# Patient Record
Sex: Male | Born: 1985
Health system: Southern US, Community
[De-identification: ages and names within clinical notes are randomized; demographics above are authoritative.]

## PROBLEM LIST (undated history)

## (undated) DIAGNOSIS — K2100 Gastro-esophageal reflux disease with esophagitis, without bleeding: Secondary | ICD-10-CM

## (undated) DIAGNOSIS — S43006A Unspecified dislocation of unspecified shoulder joint, initial encounter: Secondary | ICD-10-CM

## (undated) DIAGNOSIS — F419 Anxiety disorder, unspecified: Secondary | ICD-10-CM

## (undated) DIAGNOSIS — F439 Reaction to severe stress, unspecified: Secondary | ICD-10-CM

## (undated) DIAGNOSIS — K449 Diaphragmatic hernia without obstruction or gangrene: Secondary | ICD-10-CM

## (undated) DIAGNOSIS — K219 Gastro-esophageal reflux disease without esophagitis: Secondary | ICD-10-CM

## (undated) DIAGNOSIS — F329 Major depressive disorder, single episode, unspecified: Secondary | ICD-10-CM

## (undated) DIAGNOSIS — F32A Depression, unspecified: Secondary | ICD-10-CM

## (undated) HISTORY — DX: Gastro-esophageal reflux disease with esophagitis: K21.0

## (undated) HISTORY — DX: Diaphragmatic hernia without obstruction or gangrene: K44.9

## (undated) HISTORY — DX: Reaction to severe stress, unspecified: F43.9

## (undated) HISTORY — DX: Gastro-esophageal reflux disease with esophagitis, without bleeding: K21.00

## (undated) HISTORY — PX: HYDROCELE EXCISION: SHX482

---
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
## 2007-03-30 ENCOUNTER — Ambulatory Visit (HOSPITAL_COMMUNITY): Payer: Self-pay | Admitting: Psychiatry

## 2007-04-28 ENCOUNTER — Ambulatory Visit (HOSPITAL_COMMUNITY): Payer: Self-pay | Admitting: Psychology

## 2007-05-14 ENCOUNTER — Emergency Department (HOSPITAL_COMMUNITY): Admission: EM | Admit: 2007-05-14 | Discharge: 2007-05-14 | Payer: Self-pay | Admitting: Emergency Medicine

## 2007-08-24 ENCOUNTER — Emergency Department (HOSPITAL_COMMUNITY): Admission: EM | Admit: 2007-08-24 | Discharge: 2007-08-24 | Payer: Self-pay | Admitting: Emergency Medicine

## 2010-09-20 ENCOUNTER — Encounter: Payer: Self-pay | Admitting: Internal Medicine

## 2011-02-04 ENCOUNTER — Emergency Department (HOSPITAL_COMMUNITY): Payer: Self-pay

## 2011-02-04 ENCOUNTER — Emergency Department (HOSPITAL_COMMUNITY)
Admission: EM | Admit: 2011-02-04 | Discharge: 2011-02-04 | Disposition: A | Discharge status: Home / Self Care | Payer: Self-pay | Attending: Emergency Medicine | Admitting: Emergency Medicine

## 2011-02-04 DIAGNOSIS — S93409A Sprain of unspecified ligament of unspecified ankle, initial encounter: Secondary | ICD-10-CM | POA: Insufficient documentation

## 2011-02-04 DIAGNOSIS — Y92009 Unspecified place in unspecified non-institutional (private) residence as the place of occurrence of the external cause: Secondary | ICD-10-CM | POA: Insufficient documentation

## 2011-02-04 DIAGNOSIS — X500XXA Overexertion from strenuous movement or load, initial encounter: Secondary | ICD-10-CM | POA: Insufficient documentation

## 2012-06-30 HISTORY — PX: OTHER SURGICAL HISTORY: SHX169

## 2013-02-01 ENCOUNTER — Other Ambulatory Visit (HOSPITAL_COMMUNITY): Payer: Self-pay | Admitting: Physician Assistant

## 2013-02-01 DIAGNOSIS — K529 Noninfective gastroenteritis and colitis, unspecified: Secondary | ICD-10-CM

## 2013-02-01 DIAGNOSIS — R109 Unspecified abdominal pain: Secondary | ICD-10-CM

## 2013-02-05 ENCOUNTER — Ambulatory Visit (HOSPITAL_COMMUNITY)
Admission: RE | Admit: 2013-02-05 | Discharge: 2013-02-05 | Disposition: A | Discharge status: Home / Self Care | Payer: BC Managed Care – PPO | Source: Ambulatory Visit | Attending: Physician Assistant | Admitting: Physician Assistant

## 2013-02-05 ENCOUNTER — Other Ambulatory Visit (HOSPITAL_COMMUNITY): Payer: Self-pay | Admitting: Physician Assistant

## 2013-02-05 DIAGNOSIS — R109 Unspecified abdominal pain: Secondary | ICD-10-CM

## 2013-02-05 DIAGNOSIS — K529 Noninfective gastroenteritis and colitis, unspecified: Secondary | ICD-10-CM

## 2013-02-06 ENCOUNTER — Ambulatory Visit (INDEPENDENT_AMBULATORY_CARE_PROVIDER_SITE_OTHER): Payer: BC Managed Care – PPO | Admitting: Gastroenterology

## 2013-02-06 ENCOUNTER — Ambulatory Visit (HOSPITAL_COMMUNITY)
Admission: RE | Admit: 2013-02-06 | Discharge: 2013-02-06 | Disposition: A | Discharge status: Home / Self Care | Payer: BC Managed Care – PPO | Source: Ambulatory Visit | Attending: Gastroenterology | Admitting: Gastroenterology

## 2013-02-06 ENCOUNTER — Other Ambulatory Visit: Payer: Self-pay | Admitting: Gastroenterology

## 2013-02-06 ENCOUNTER — Ambulatory Visit: Payer: BC Managed Care – PPO | Admitting: Gastroenterology

## 2013-02-06 ENCOUNTER — Other Ambulatory Visit: Payer: Self-pay

## 2013-02-06 ENCOUNTER — Encounter: Payer: Self-pay | Admitting: Gastroenterology

## 2013-02-06 VITALS — BP 116/76 | HR 68 | Temp 97.6°F | Ht 72.0 in | Wt 143.0 lb

## 2013-02-06 DIAGNOSIS — R1110 Vomiting, unspecified: Secondary | ICD-10-CM | POA: Insufficient documentation

## 2013-02-06 DIAGNOSIS — R1013 Epigastric pain: Secondary | ICD-10-CM

## 2013-02-06 DIAGNOSIS — R9389 Abnormal findings on diagnostic imaging of other specified body structures: Secondary | ICD-10-CM | POA: Insufficient documentation

## 2013-02-06 DIAGNOSIS — R112 Nausea with vomiting, unspecified: Secondary | ICD-10-CM

## 2013-02-06 DIAGNOSIS — R195 Other fecal abnormalities: Secondary | ICD-10-CM | POA: Insufficient documentation

## 2013-02-06 MED ORDER — IOHEXOL 300 MG/ML  SOLN
100.0000 mL | Freq: Once | INTRAMUSCULAR | Status: AC | PRN
  Administered 2013-02-06: 100 mL via INTRAVENOUS

## 2013-02-06 MED ORDER — HYDROCODONE-ACETAMINOPHEN 5-325 MG PO TABS: 1.0000 | ORAL_TABLET | Freq: Four times a day (QID) | ORAL | Status: DC | PRN

## 2013-02-06 NOTE — Progress Notes (Signed)
Primary Care Physician:  Colette Ribas, MD  Primary Gastroenterologist:    Chief Complaint  Patient presents with  . Abdominal Pain    HPI:  Colin Oliver is a 27 y.o. male here for further evaluation of abdominal pain. Patient states that he was doing very well up until last Monday evening when he developed fever. This was associated initially with abdominal pain in the upper abdomen. Temperature got as high as 103. Patient states his fever lasted for about 24 hours. He developed vomiting and diarrhea on Tuesday/Wednesday. He states he vomited about 5 or 6 times and had diarrhea several times over 24 hours or so. He was able to go back to work Wednesday night but felt very weak. Thursday went to PCP due to ongoing abdominal pain. Review blood work done last week including CBC, basic metabolic panel, LFTs. He had a glucose of 133 and eosinophil count 6% but otherwise unremarkable. Hemoglobin A1c was 5.3. Patient states he turned in 3 Hemoccult cards and reports that they were positive. Abd u/s negative yesterday. Patient denies melena or rectal bleeding. Epigastric burning. No heartburn. BM now normal. Appetite has returned. Last meal was Chicken sandwich and fries from [employer] yesterday afternoon. About 15-30 minutes after eating he has increased epigastric pain and burning. Weight is down about 6 pounds with current illness.   Two weeks ago, sister, stepmother were sick but he did not go around him. His daughter developed diarrhea couple of days ago.  Given RX for omeprazole 40mg  daily today by PCP. H. pylori serologies pending  Current Outpatient Prescriptions  Medication Sig Dispense Refill  . clonazePAM (KLONOPIN) 1 MG tablet Take 1 mg by mouth 2 (two) times daily as needed for anxiety.      . promethazine (PHENERGAN) 25 MG tablet Take 25 mg by mouth every 6 (six) hours as needed for nausea.      . sertraline (ZOLOFT) 50 MG tablet Take 50 mg by mouth daily.       No current  facility-administered medications for this visit.    Allergies as of 02/06/2013  . (No Known Allergies)    Past Medical History  Diagnosis Date  . Stress     Past Surgical History  Procedure Laterality Date  . Right shoulder  06/2012  . Hydrocele excision      age 36    Family History  Problem Relation Age of Onset  . Gallbladder disease      Multiple family members    History   Social History  . Marital Status: Single    Spouse Name: N/A    Number of Children: 1  . Years of Education: N/A   Occupational History  . FT student, Optician, dispensing   . [employer] FT, third shift    Social History Main Topics  . Smoking status: Current Every Day Smoker -- 1.00 packs/day    Types: Cigarettes  . Smokeless tobacco: Not on file  . Alcohol Use: Yes     Comment: socially, beer, 6 every other weekend, one to 2 a few days a week  . Drug Use: No  . Sexually Active: Not on file   Other Topics Concern  . Not on file   Social History Narrative  . No narrative on file      ROS:  General: see hpi Eyes: Negative for vision changes.  ENT: Negative for hoarseness, difficulty swallowing , nasal congestion. CV: Negative for chest pain, angina, palpitations, dyspnea on exertion, peripheral edema.  Respiratory: Negative for dyspnea at rest, dyspnea on exertion, cough, sputum, wheezing.  GI: See history of present illness. GU:  Negative for dysuria, hematuria, urinary incontinence, urinary frequency, nocturnal urination.  MS: Negative for joint pain, low back pain.  Derm: Negative for rash or itching.  Neuro: Negative for weakness, abnormal sensation, seizure, frequent headaches, memory loss, confusion.  Psych: Negative for anxiety, depression, suicidal ideation, hallucinations.  Endo: Negative for unusual weight change. See hpi Heme: Negative for bruising or bleeding. Allergy: Negative for rash or hives.    Physical Examination:  BP 116/76  Pulse 68  Temp(Src) 97.6 F (36.4  C) (Oral)  Ht 6' (1.829 m)  Wt 143 lb (64.864 kg)  BMI 19.39 kg/m2   General: Well-nourished, well-developed in no acute distress.  Head: Normocephalic, atraumatic.   Eyes: Conjunctiva pink, no icterus. Mouth: Oropharyngeal mucosa moist and pink , no lesions erythema or exudate. Neck: Supple without thyromegaly, masses, or lymphadenopathy.  Lungs: Clear to auscultation bilaterally.  Heart: Regular rate and rhythm, no murmurs rubs or gallops.  Abdomen: Bowel sounds are normal, moderate epigastric/ruq tenderness, nondistended, no hepatosplenomegaly or masses, no abdominal bruits or hernia , no rebound or guarding.   Rectal: not performed Extremities: No lower extremity edema. No clubbing or deformities.  Neuro: Alert and oriented x 4 , grossly normal neurologically.  Skin: Warm and dry, no rash or jaundice.   Psych: Alert and cooperative, normal mood and affect.  Imaging Studies: US Abdomen Limited Ruq  02-28-13   *RADIOLOGY REPORT*  Clinical Data:  Abdominal pain.  GALLBLADDER ULTRASOUND  Comparison:  None  Findings:  Gallbladder:  No gallstones, gallbladder wall thickening, or pericholecystic fluid.  Common Bile Duct:  Within normal limits in caliber.  Liver:  No focal lesion identified.  Within normal limits in parenchymal echogenicity.  IMPRESSION: Negative gallbladder ultrasound.   Original Report Authenticated By: Signa Kell, M.D.    Labs: 02/01/2013 Sodium 137, potassium 3.9, glucose 133, BUN 8, creatinine 0.89, total bilirubin 0.4, alkaline phosphatase 76, AST 19, ALT 19, albumin 4.2, white blood cell count 6100, hemoglobin 14.6, hematocrit 41.7, MCV 86.7, platelets 235,000, eosinophil count 6%, hemoglobin A1c 5.3.

## 2013-02-06 NOTE — Assessment & Plan Note (Signed)
27 year old gentleman with what sounds like a recent acute gastroenteritis including fever, vomiting, diarrhea, multiple ill contacts he presents for further evaluation of ongoing upper abdominal pain and Hemoccult-positive stool. Recent lab work unremarkable. Abdominal ultrasound negative. At this point cannot exclude gastritis plus or minus abdominal wall pain from heaving superimposed on recent viral gastroenteritis. Ddx includes pancreatitis, PUD, biliary. Discussed at length with patient today, given significant abdominal pain I feel he needs to have stat CT of the abdomen and pelvis with contrast to look at his pancreas as well as reevaluate his gallbladder. If CT is negative he will likely need to have had a minimal upper endoscopy for ongoing upper abdominal pain and Hemoccult-positive stool. May ultimately need HIDA scan to exclude gallbladder disease. Fully agree with adding PPI at this time. I have given him Vicodin to use sparingly for pain. We will followup on H. pylori serologies as they become available. I have added on lipase to today's blood work as well. Further recommendations to follow.

## 2013-02-06 NOTE — Progress Notes (Signed)
CC PCP 

## 2013-02-06 NOTE — Patient Instructions (Addendum)
1. CT scan as scheduled. 2. I have given you prescription for pain medication. Please be aware it may cause drowsiness. 3. After your blood work and CT scan results are available, we will touch base with you and determine what needs to be done next. You may still need further evaluation of the small amount of blood found in your stool.

## 2013-02-06 NOTE — Progress Notes (Signed)
Quick Note:  Spoke with Dr. Archer Asa. Patient's CT shows abnormal right kidney. CT reviewed with several radiologist.   Focal rounded 1.8 x 1.4 cm of abnormal parenchymal enhancement in the lateral aspect of the right lower renal pole. Could be anything from focal infection of kidney to benign finding to early renal cell CA.  Needs U/A with culture now.  If U/A is negative, he will need MRI Abd with and without contrast to look at the kidney more closely. DO NOT ORDER YET! Please inquire if he is having right sided back pain. If he develops recurrent fever or increasing pain he needs to let me know or go to ED. ______

## 2013-02-07 ENCOUNTER — Telehealth: Payer: Self-pay | Admitting: Gastroenterology

## 2013-02-07 ENCOUNTER — Other Ambulatory Visit: Payer: Self-pay | Admitting: Gastroenterology

## 2013-02-07 ENCOUNTER — Telehealth: Payer: Self-pay | Admitting: Internal Medicine

## 2013-02-07 LAB — COMPREHENSIVE METABOLIC PANEL
ALT: 19 U/L (ref 10–40)
Albumin: 4.2
Alkaline Phosphatase: 76 U/L
BUN: 8 mg/dL (ref 4–21)
Glucose: 133
Potassium: 3.9 mmol/L
Total Bilirubin: 0.4 mg/dL

## 2013-02-07 NOTE — Telephone Encounter (Signed)
U/A is not back yet and pt is aware.

## 2013-02-07 NOTE — Telephone Encounter (Signed)
Calling asking for lab results as well as letting us know he is still in a lot of pain please advise?

## 2013-02-07 NOTE — Telephone Encounter (Signed)
Pt called this afternoon asking if his lab results were back yet. Please advise and call him back [phone]

## 2013-02-08 LAB — URINALYSIS W MICROSCOPIC + REFLEX CULTURE
Bilirubin Urine: NEGATIVE
Protein, ur: NEGATIVE mg/dL
Urobilinogen, UA: 0.2 mg/dL (ref 0.0–1.0)

## 2013-02-08 NOTE — Progress Notes (Signed)
Quick Note:  U/A with evidence of infection. Please make sure lab did a culture. Patient should start Cipro 500mg  po bid for 7 days. Call in RX for QS. Is he having any pain with urination or penile discharge? If abdominal pain worsens or develops fever, back pain, he should go to ER. ______

## 2013-02-08 NOTE — Telephone Encounter (Signed)
Routing to LSL- UA is back but culture is still pending.

## 2013-02-08 NOTE — Telephone Encounter (Signed)
Please call patient with u/a results. Needs antibiotic.

## 2013-02-09 NOTE — Telephone Encounter (Signed)
See results note. 

## 2013-02-10 LAB — URINE CULTURE: Colony Count: >=100000

## 2013-02-19 NOTE — Progress Notes (Signed)
Quick Note:  Called to speak with patient. LMOAM.  1. He should have completed antibiotics by now.Find out how he is doing? Any abdominal pain, dysuria, penile discharge.  2. He is going to need MRI Abdomen with and without contrast in one month to f/u on right kidney abnormality. (Discussed with Dr. Jena Gauss). 3. He may need to see urologist if MRI comes back normal as young men do not routinely develop kidney infections. But we will await MRI findings first. ______

## 2013-02-22 NOTE — Progress Notes (Signed)
Quick Note:  Tried to call pt- LMOM ______ 

## 2013-02-26 NOTE — Progress Notes (Signed)
Cc Results to PCP and nicd MRI in the Recalls

## 2013-02-27 ENCOUNTER — Telehealth: Payer: Self-pay

## 2013-02-27 NOTE — Telephone Encounter (Signed)
This has already been nicd in the computer

## 2013-02-27 NOTE — Telephone Encounter (Signed)
Pt finally returned call from 02/26/13 CT results. His urine is now normal color and smell, no abnormal discharge. He had diarrhea for 3 weeks and everytime he ate something he would have to run to the bathroom to have a BM. He said his bowel movements have been normal for the past 2 days and he has been eating ok for the past 2 days. He still has the upper abd pain that comes and goes and sometimes when it starts it doubles him over with pain.   He stated it was fine to set him up for an MRI in one month. He will need to work around some court dates for his custody trial. Benedetto Goad, do you want to nic this or go ahead and set it up now?

## 2013-02-28 NOTE — Telephone Encounter (Addendum)
Need copy of H.Pylori serologies done by PCP. MRI as planned. For ongoing upper abdominal pain and heme positive stool-->consider EGD if patient agreeable. Needs to take PPI as encouraged at time of OV.  If not interested in pursuing EGD at this time, take PPI, do ifobt, OV with RMR 03/2013.

## 2013-03-01 NOTE — Telephone Encounter (Signed)
Darl Pikes, please get copy of hpylori blood work from [facility]

## 2013-03-05 NOTE — Telephone Encounter (Signed)
Tried to call pt- LMOM 

## 2013-03-06 ENCOUNTER — Encounter: Payer: Self-pay | Admitting: Internal Medicine

## 2013-03-06 ENCOUNTER — Other Ambulatory Visit (HOSPITAL_COMMUNITY): Payer: Self-pay | Admitting: Physician Assistant

## 2013-03-06 DIAGNOSIS — N059 Unspecified nephritic syndrome with unspecified morphologic changes: Secondary | ICD-10-CM

## 2013-03-06 NOTE — Telephone Encounter (Signed)
Spoke with pt- it is ok to set up egd with RMR. Leighann, please set up egd.  hpylori bloodwork on LSL cart.

## 2013-03-06 NOTE — Telephone Encounter (Signed)
Requested records from PCP and pt is aware of OV on 8/8 at 10 with RMR and appt card was mailed

## 2013-03-06 NOTE — Telephone Encounter (Signed)
His 30 day H&P is up, do we retriage or what? He is scheduled to see RMR in the office on 8/8 please advise?

## 2013-03-07 ENCOUNTER — Ambulatory Visit (HOSPITAL_COMMUNITY)
Admission: RE | Admit: 2013-03-07 | Discharge: 2013-03-07 | Disposition: A | Discharge status: Home / Self Care | Payer: BC Managed Care – PPO | Source: Ambulatory Visit | Attending: Physician Assistant | Admitting: Physician Assistant

## 2013-03-07 DIAGNOSIS — N289 Disorder of kidney and ureter, unspecified: Secondary | ICD-10-CM | POA: Insufficient documentation

## 2013-03-07 DIAGNOSIS — N059 Unspecified nephritic syndrome with unspecified morphologic changes: Secondary | ICD-10-CM

## 2013-03-07 MED ORDER — GADOBENATE DIMEGLUMINE 529 MG/ML IV SOLN
13.0000 mL | Freq: Once | INTRAVENOUS | Status: AC | PRN
  Administered 2013-03-07: 13 mL via INTRAVENOUS

## 2013-03-08 NOTE — Telephone Encounter (Addendum)
Triage him since he is a straightforward patient. He will need Phenergan 25mg  IV 30 mins before procedure.

## 2013-03-08 NOTE — Telephone Encounter (Signed)
Routing to Doris for Triage 

## 2013-03-09 ENCOUNTER — Telehealth: Payer: Self-pay

## 2013-03-09 NOTE — Telephone Encounter (Signed)
Pt called-  He stated he knew we were going to do an MRI in 1 month but he went to [facility] and was sent for an MRI yesterday. He said he was told by the radiologist that he had a lesion on his kidney and a growth on his kidney. He said he is extremely worried about this and has tried to call Robbie Lis several times and cant get thru. He is worried something is wrong. He wants to know if LSL can look at it and tell him what is wrong.

## 2013-03-12 NOTE — Telephone Encounter (Signed)
Would have been helpful if he had waited for one month after completion of antibiotics. This was to allow signs of infection to be gone.  MRI shows ?scarring possibly from prior kidney infection.  I would recommend he see an urologist for opinion regarding CT and MRI findings as well as pyelonephritis in young male.  EGD as planned. OV with RMR as planned.

## 2013-03-13 NOTE — Telephone Encounter (Signed)
Tried to call pt- LMOM. Asked pt to call me if Colin Oliver did not give him his results.

## 2013-03-14 ENCOUNTER — Telehealth (INDEPENDENT_AMBULATORY_CARE_PROVIDER_SITE_OTHER): Payer: Self-pay

## 2013-03-14 ENCOUNTER — Other Ambulatory Visit: Payer: Self-pay

## 2013-03-14 ENCOUNTER — Other Ambulatory Visit: Payer: Self-pay | Admitting: Gastroenterology

## 2013-03-14 DIAGNOSIS — R93429 Abnormal radiologic findings on diagnostic imaging of unspecified kidney: Secondary | ICD-10-CM

## 2013-03-14 DIAGNOSIS — R1013 Epigastric pain: Secondary | ICD-10-CM

## 2013-03-14 DIAGNOSIS — N12 Tubulo-interstitial nephritis, not specified as acute or chronic: Secondary | ICD-10-CM

## 2013-03-14 NOTE — Telephone Encounter (Signed)
Noted  

## 2013-03-14 NOTE — Telephone Encounter (Signed)
Patient is scheduled with Dr. Annabell Howells at [facility] Friday 03/16/13 at 9:45 and I have faxed notes over

## 2013-03-14 NOTE — Telephone Encounter (Signed)
Spoke with pt- he did speak with Robbie Lis about his results. Tyler Aas is triaging pt for egd and pt is scheduled to see RMR on 04/06/13. He said it was ok to refer to urology. Benedetto Goad, please refer.

## 2013-03-14 NOTE — Telephone Encounter (Signed)
Pt called to schedule the EGD. He had rotator cuff surgery on his right shoulder in 06/2012.   Reviewed his meds and allergies. He is scheduled for 03/21/2013 at 1:30 PM with RMR.

## 2013-03-14 NOTE — Telephone Encounter (Signed)
See triage

## 2013-03-14 NOTE — Telephone Encounter (Signed)
error 

## 2013-03-15 NOTE — Telephone Encounter (Signed)
EGD instructions mailed to pt.

## 2013-03-16 ENCOUNTER — Ambulatory Visit (INDEPENDENT_AMBULATORY_CARE_PROVIDER_SITE_OTHER): Payer: BC Managed Care – PPO | Admitting: Urology

## 2013-03-16 DIAGNOSIS — A630 Anogenital (venereal) warts: Secondary | ICD-10-CM

## 2013-03-16 DIAGNOSIS — N10 Acute tubulo-interstitial nephritis: Secondary | ICD-10-CM

## 2013-03-19 ENCOUNTER — Encounter (HOSPITAL_COMMUNITY): Payer: Self-pay | Admitting: Pharmacy Technician

## 2013-03-21 ENCOUNTER — Ambulatory Visit (HOSPITAL_COMMUNITY)
Admission: RE | Admit: 2013-03-21 | Discharge: 2013-03-21 | Disposition: A | Discharge status: Home / Self Care | Payer: BC Managed Care – PPO | Source: Ambulatory Visit | Attending: Internal Medicine | Admitting: Internal Medicine

## 2013-03-21 ENCOUNTER — Encounter (HOSPITAL_COMMUNITY)
Admission: RE | Disposition: A | Discharge status: Home / Self Care | Payer: Self-pay | Source: Ambulatory Visit | Attending: Internal Medicine

## 2013-03-21 ENCOUNTER — Encounter (HOSPITAL_COMMUNITY): Payer: Self-pay | Admitting: *Deleted

## 2013-03-21 DIAGNOSIS — K449 Diaphragmatic hernia without obstruction or gangrene: Secondary | ICD-10-CM | POA: Insufficient documentation

## 2013-03-21 DIAGNOSIS — K3189 Other diseases of stomach and duodenum: Secondary | ICD-10-CM

## 2013-03-21 DIAGNOSIS — K2940 Chronic atrophic gastritis without bleeding: Secondary | ICD-10-CM | POA: Insufficient documentation

## 2013-03-21 DIAGNOSIS — K2960 Other gastritis without bleeding: Secondary | ICD-10-CM

## 2013-03-21 DIAGNOSIS — R1013 Epigastric pain: Secondary | ICD-10-CM

## 2013-03-21 DIAGNOSIS — K2100 Gastro-esophageal reflux disease with esophagitis, without bleeding: Secondary | ICD-10-CM | POA: Insufficient documentation

## 2013-03-21 DIAGNOSIS — R109 Unspecified abdominal pain: Secondary | ICD-10-CM | POA: Insufficient documentation

## 2013-03-21 HISTORY — PX: ESOPHAGOGASTRODUODENOSCOPY: SHX5428

## 2013-03-21 SURGERY — EGD (ESOPHAGOGASTRODUODENOSCOPY)
Anesthesia: Moderate Sedation

## 2013-03-21 MED ORDER — SODIUM CHLORIDE 0.9 % IV SOLN
INTRAVENOUS | Status: DC
  Administered 2013-03-21: 14:00:00 via INTRAVENOUS

## 2013-03-21 MED ORDER — PROMETHAZINE HCL 25 MG/ML IJ SOLN
25.0000 mg | Freq: Once | INTRAMUSCULAR | Status: AC
  Administered 2013-03-21: 25 mg via INTRAVENOUS

## 2013-03-21 MED ORDER — BUTAMBEN-TETRACAINE-BENZOCAINE 2-2-14 % EX AERO
INHALATION_SPRAY | CUTANEOUS | Status: DC | PRN
  Administered 2013-03-21: 2 via TOPICAL

## 2013-03-21 MED ORDER — ONDANSETRON HCL 4 MG/2ML IJ SOLN
INTRAMUSCULAR | Status: AC
  Filled 2013-03-21: qty 2

## 2013-03-21 MED ORDER — SODIUM CHLORIDE 0.9 % IJ SOLN
INTRAMUSCULAR | Status: AC
  Filled 2013-03-21: qty 10

## 2013-03-21 MED ORDER — MIDAZOLAM HCL 5 MG/5ML IJ SOLN
INTRAMUSCULAR | Status: DC | PRN
  Administered 2013-03-21 (×2): 2 mg via INTRAVENOUS

## 2013-03-21 MED ORDER — MEPERIDINE HCL 100 MG/ML IJ SOLN
INTRAMUSCULAR | Status: AC
  Filled 2013-03-21: qty 2

## 2013-03-21 MED ORDER — STERILE WATER FOR IRRIGATION IR SOLN
Status: DC | PRN
  Administered 2013-03-21: 14:00:00

## 2013-03-21 MED ORDER — PROMETHAZINE HCL 25 MG/ML IJ SOLN
INTRAMUSCULAR | Status: AC
  Filled 2013-03-21: qty 1

## 2013-03-21 MED ORDER — MIDAZOLAM HCL 5 MG/5ML IJ SOLN
INTRAMUSCULAR | Status: AC
  Filled 2013-03-21: qty 10

## 2013-03-21 MED ORDER — MEPERIDINE HCL 100 MG/ML IJ SOLN
INTRAMUSCULAR | Status: DC | PRN
  Administered 2013-03-21 (×2): 50 mg via INTRAVENOUS

## 2013-03-21 MED ORDER — ONDANSETRON HCL 4 MG/2ML IJ SOLN
INTRAMUSCULAR | Status: DC | PRN
  Administered 2013-03-21: 4 mg via INTRAVENOUS

## 2013-03-21 NOTE — H&P (Signed)
Chief Complaint   Patient presents with   .  Abdominal Pain        HPI:  Colin Oliver is a 27 y.o. male here for further evaluation of abdominal pain.   Recent fever. This was associated initially with abdominal pain in the upper abdomen. Temperature got as high as 103. Patient states his fever lasted for about 24 hours. He developed vomiting and diarrhea   He states he vomited about 5 or 6 times and had diarrhea several times over 24 hours or so. He was able to go back to work Wednesday night but felt very weak. Thursday went to PCP due to ongoing abdominal pain. Review blood work done last week including CBC, basic metabolic panel, LFTs. He had a glucose of 133 and eosinophil count 6% but otherwise unremarkable. Hemoglobin A1c was 5.3. Patient states he turned in 3 Hemoccult cards and reports that they were positive. Abd u/s negative yesterday. Patient denies melena or rectal bleeding. Epigastric burning. No heartburn. BM now normal. Appetite has returned. Last meal was Chicken sandwich and fries from [employer] yesterday afternoon. About 15-30 minutes after eating he has increased epigastric pain and burning. Weight is down about 6 pounds with current illness.  CT showed abnormal right kidney. MRI revealed scarring from possible Pyelonephritis. Patient completed a course of antibiotics.   Two weeks ago, sister, stepmother were sick but he did not go around him. His daughter developed diarrhea couple of days ago.   Given RX for omeprazole 40mg  daily today by PCP. H. pylori serologies pending    Current Outpatient Prescriptions   Medication  Sig  Dispense  Refill   .  clonazePAM (KLONOPIN) 1 MG tablet  Take 1 mg by mouth 2 (two) times daily as needed for anxiety.         .  promethazine (PHENERGAN) 25 MG tablet  Take 25 mg by mouth every 6 (six) hours as needed for nausea.         .  sertraline (ZOLOFT) 50 MG tablet  Take 50 mg by mouth daily.             No current  facility-administered medications for this visit.         Allergies as of 02/06/2013   .  (No Known Allergies)         Past Medical History   Diagnosis  Date   .  Stress           Past Surgical History   Procedure  Laterality  Date   .  Right shoulder    06/2012   .  Hydrocele excision           age 75         Family History   Problem  Relation  Age of Onset   .  Gallbladder disease           Multiple family members         History       Social History   .  Marital Status:  Single       Spouse Name:  N/A       Number of Children:  1   .  Years of Education:  N/A       Occupational History   .  FT student, Optician, dispensing     .  [employer] FT, third shift         Social History Main Topics   .  Smoking  status:  Current Every Day Smoker -- 1.00 packs/day       Types:  Cigarettes   .  Smokeless tobacco:  Not on file   .  Alcohol Use:  Yes         Comment: socially, beer, 6 every other weekend, one to 2 a few days a week   .  Drug Use:  No   .  Sexually Active:  Not on file       Other Topics  Concern   .  Not on file       Social History Narrative   .  No narrative on file          ROS:   General: see hpi Eyes: Negative for vision changes.   ENT: Negative for hoarseness, difficulty swallowing , nasal congestion. CV: Negative for chest pain, angina, palpitations, dyspnea on exertion, peripheral edema.   Respiratory: Negative for dyspnea at rest, dyspnea on exertion, cough, sputum, wheezing.   GI: See history of present illness. GU:  Negative for dysuria, hematuria, urinary incontinence, urinary frequency, nocturnal urination.   MS: Negative for joint pain, low back pain.   Derm: Negative for rash or itching.   Neuro: Negative for weakness, abnormal sensation, seizure, frequent headaches, memory loss, confusion.   Psych: Negative for anxiety, depression, suicidal ideation, hallucinations.   Endo: Negative for unusual weight change. See  hpi Heme: Negative for bruising or bleeding. Allergy: Negative for rash or hives.     Physical Examination:       General: Well-nourished, well-developed in no acute distress.   Head: Normocephalic, atraumatic.    Eyes: Conjunctiva pink, no icterus. Mouth: Oropharyngeal mucosa moist and pink , no lesions erythema or exudate. Neck: Supple without thyromegaly, masses, or lymphadenopathy.   Lungs: Clear to auscultation bilaterally.   Heart: Regular rate and rhythm, no murmurs rubs or gallops.   Abdomen: Bowel sounds are normal, moderate epigastric/ruq tenderness, nondistended, no hepatosplenomegaly or masses, no abdominal bruits or hernia , no rebound or guarding.    Rectal: not performed Extremities: No lower extremity edema. No clubbing or deformities.   Neuro: Alert and oriented x 4 , grossly normal neurologically.   Skin: Warm and dry, no rash or jaundice.    Psych: Alert and cooperative, normal mood and affect.   Imaging Studies: US Abdomen Limited Ruq   02/05/2013   *RADIOLOGY REPORT*  Clinical Data:  Abdominal pain.  GALLBLADDER ULTRASOUND  Comparison:  None  Findings:  Gallbladder:  No gallstones, gallbladder wall thickening, or pericholecystic fluid.  Common Bile Duct:  Within normal limits in caliber.  Liver:  No focal lesion identified.  Within normal limits in parenchymal echogenicity.  IMPRESSION: Negative gallbladder ultrasound.   Original Report Authenticated By: Signa Kell, M.D.      Labs: 02/01/2013 Sodium 137, potassium 3.9, glucose 133, BUN 8, creatinine 0.89, total bilirubin 0.4, alkaline phosphatase 76, AST 19, ALT 19, albumin 4.2, white blood cell count 6100, hemoglobin 14.6, hematocrit 41.7, MCV 86.7, platelets 235,000, eosinophil count 6%, hemoglobin A1c 5.3.         Impression:     Abdominal pain, epigastric         27 year old gentleman with what sounds like a recent acute gastroenteritis including fever, vomiting, diarrhea, multiple ill contacts  he presents for further evaluation of ongoing upper abdominal pain and Hemoccult-positive stool. Recent lab work unremarkable. Abdominal ultrasound negative. At this point cannot exclude gastritis plus or minus abdominal wall pain from heaving superimposed  on recent viral gastroenteritis. Ddx includes pancreatitis, PUD, biliary.  Fully agree with adding PPI at this time. I have given him Vicodin to use sparingly for pain. We will followup on H. pylori serologies as they become available. I have added on lipase to today's blood work as well. Further recommendations to follow.         Leigh A Watson at 02/06/2013 11:20 AM    Status: Signed                   CC PCP             Not recorded     Medications Ordered This Encounter      Disp Refills Start End    HYDROcodone-acetaminophen (NORCO/VICODIN) 5-325 MG per tablet (Discontinued) 20 tablet 0 02/06/2013 03/19/2013    Take 1 tablet by mouth every 6 (six) hours as needed for pain. - Oral    Reason for Discontinue: Patient has not taken in last 30 days         Orders Placed This Encounter    Lipase [LAB99 Custom]       Future Labs/Procedures    CT Abdomen Pelvis W Contrast [WUJ811 Custom]    Expected by: As directed    Expires: 05/09/2014       Results are available for this encounter         Patient Instructions    CT scan as scheduled. I have given you prescription for pain medication. Please be aware it may cause drowsiness. After your blood work and CT scan results are available, we will touch base with you and determine what needs to be done next. You may still need further evaluation of the small amount of blood found in your stool.    Chief Complaint   Patient presents with   .  Abdominal Pain        HPI:  Colin Oliver is a 27 y.o. male here for further evaluation of abdominal pain. Patient states that he was doing very well up until last Monday evening when he developed fever. This was associated  initially with abdominal pain in the upper abdomen. Temperature got as high as 103. Patient states his fever lasted for about 24 hours. He developed vomiting and diarrhea on Tuesday/Wednesday. He states he vomited about 5 or 6 times and had diarrhea several times over 24 hours or so. He was able to go back to work Wednesday night but felt very weak. Thursday went to PCP due to ongoing abdominal pain. Review blood work done last week including CBC, basic metabolic panel, LFTs. He had a glucose of 133 and eosinophil count 6% but otherwise unremarkable. Hemoglobin A1c was 5.3. Patient states he turned in 3 Hemoccult cards and reports that they were positive. Abd u/s negative yesterday. Patient denies melena or rectal bleeding. Epigastric burning. No heartburn. BM now normal. Appetite has returned. Last meal was Chicken sandwich and fries from [employer] yesterday afternoon. About 15-30 minutes after eating he has increased epigastric pain and burning. Weight is down about 6 pounds with current illness.    Two weeks ago, sister, stepmother were sick but he did not go around him. His daughter developed diarrhea couple of days ago.   Given RX for omeprazole 40mg  daily today by PCP. H. pylori serologies pending    Current Outpatient Prescriptions   Medication  Sig  Dispense  Refill   .  clonazePAM (KLONOPIN) 1 MG tablet  Take 1 mg  by mouth 2 (two) times daily as needed for anxiety.         .  promethazine (PHENERGAN) 25 MG tablet  Take 25 mg by mouth every 6 (six) hours as needed for nausea.         .  sertraline (ZOLOFT) 50 MG tablet  Take 50 mg by mouth daily.             No current facility-administered medications for this visit.         Allergies as of 02/06/2013   .  (No Known Allergies)         Past Medical History   Diagnosis  Date   .  Stress           Past Surgical History   Procedure  Laterality  Date   .  Right shoulder    06/2012   .  Hydrocele excision           age 40          Family History   Problem  Relation  Age of Onset   .  Gallbladder disease           Multiple family members         History       Social History   .  Marital Status:  Single       Spouse Name:  N/A       Number of Children:  1   .  Years of Education:  N/A       Occupational History   .  FT student, Optician, dispensing     .  [employer] FT, third shift         Social History Main Topics   .  Smoking status:  Current Every Day Smoker -- 1.00 packs/day       Types:  Cigarettes   .  Smokeless tobacco:  Not on file   .  Alcohol Use:  Yes         Comment: socially, beer, 6 every other weekend, one to 2 a few days a week   .  Drug Use:  No   .  Sexually Active:  Not on file       Other Topics  Concern   .  Not on file       Social History Narrative   .  No narrative on file          ROS:   General: see hpi Eyes: Negative for vision changes.   ENT: Negative for hoarseness, difficulty swallowing , nasal congestion. CV: Negative for chest pain, angina, palpitations, dyspnea on exertion, peripheral edema.   Respiratory: Negative for dyspnea at rest, dyspnea on exertion, cough, sputum, wheezing.   GI: See history of present illness. GU:  Negative for dysuria, hematuria, urinary incontinence, urinary frequency, nocturnal urination.   MS: Negative for joint pain, low back pain.   Derm: Negative for rash or itching.   Neuro: Negative for weakness, abnormal sensation, seizure, frequent headaches, memory loss, confusion.   Psych: Negative for anxiety, depression, suicidal ideation, hallucinations.   Endo: Negative for unusual weight change. See hpi Heme: Negative for bruising or bleeding. Allergy: Negative for rash or hives.     Physical Examination:   BP 116/76  Pulse 68  Temp(Src) 97.6 F (36.4 C) (Oral)  Ht 6' (1.829 m)  Wt 143 lb (64.864 kg)  BMI 19.39 kg/m2    General: Well-nourished, well-developed in no acute  distress.   Head: Normocephalic, atraumatic.     Eyes: Conjunctiva pink, no icterus. Mouth: Oropharyngeal mucosa moist and pink , no lesions erythema or exudate. Neck: Supple without thyromegaly, masses, or lymphadenopathy.   Lungs: Clear to auscultation bilaterally.   Heart: Regular rate and rhythm, no murmurs rubs or gallops.   Abdomen: Bowel sounds are normal, moderate epigastric/ruq tenderness, nondistended, no hepatosplenomegaly or masses, no abdominal bruits or hernia , no rebound or guarding.    Rectal: not performed Extremities: No lower extremity edema. No clubbing or deformities.   Neuro: Alert and oriented x 4 , grossly normal neurologically.   Skin: Warm and dry, no rash or jaundice.    Psych: Alert and cooperative, normal mood and affect.   Imaging Studies: US Abdomen Limited Ruq   02/05/2013   *RADIOLOGY REPORT*  Clinical Data:  Abdominal pain.  GALLBLADDER ULTRASOUND  Comparison:  None  Findings:  Gallbladder:  No gallstones, gallbladder wall thickening, or pericholecystic fluid.  Common Bile Duct:  Within normal limits in caliber.  Liver:  No focal lesion identified.  Within normal limits in parenchymal echogenicity.  IMPRESSION: Negative gallbladder ultrasound.   Original Report Authenticated By: Signa Kell, M.D.      Labs: 02/01/2013 Sodium 137, potassium 3.9, glucose 133, BUN 8, creatinine 0.89, total bilirubin 0.4, alkaline phosphatase 76, AST 19, ALT 19, albumin 4.2, white blood cell count 6100, hemoglobin 14.6, hematocrit 41.7, MCV 86.7, platelets 235,000, eosinophil count 6%, hemoglobin A1c 5.3.              Abdominal pain, epigastric - Tiffany Kocher, PA-C at 02/06/2013 11:07 AM    Status: Written Related Problem: Abdominal pain, epigastric           27 year old gentleman with what sounds like a recent acute gastroenteritis including fever, vomiting, diarrhea, multiple ill contacts he presents for further evaluation of ongoing upper abdominal pain and Hemoccult-positive stool. Recent lab work  unremarkable. Abdominal ultrasound negative. CT and MRI demonstrates scarring in the right kidney.  At this point cannot exclude gastritis plus or minus abdominal wall pain from heaving superimposed on recent viral gastroenteritis. Ddx includes pancreatitis, PUD, biliary.  Fully agree with adding PPI at this time. I have given him Vicodin to use sparingly for pain. We will followup on H. pylori serologies as they become available. I have added on lipase to today's blood work as well. Further recommendations to follow.   EGD today. Patient still has some right flank pain and 4/10 intensity epigastric pain.  The risks, benefits, limitations, alternatives and imponderables have been reviewed with the patient. Potential for esophageal dilation, biopsy, etc. have also been reviewed.  Questions have been answered. All parties agreeable.

## 2013-03-21 NOTE — Op Note (Signed)
[facility] [address] [place], [address]   ENDOSCOPY PROCEDURE REPORT  PATIENT: Colin Oliver, Colin Oliver  MR#: [id number] BIRTHDATE: [birth year] , 26  yrs. old GENDER: Male ENDOSCOPIST: R.  Roetta Sessions, MD FACP FACG REFERRED BY:  Assunta Found, M.D. PROCEDURE DATE:  03/21/2013 PROCEDURE:     EGD with gastric biopsy  INDICATIONS:     Dyspepsia  INFORMED CONSENT:   The risks, benefits, limitations, alternatives and imponderables have been discussed.  The potential for biopsy, esophogeal dilation, etc. have also been reviewed.  Questions have been answered.  All parties agreeable.  Please see the history and physical in the medical record for more information.  MEDICATIONS:      Versed 4 mg IV and Demerol 100 mg IV in divided doses. Phenergan 25 mg IV. Zofran 4 mg IV. Cetacaine spray.  DESCRIPTION OF PROCEDURE:   The EG-[id number] ([id number])  endoscope was introduced through the mouth and advanced to the second portion of the duodenum without difficulty or limitations.  The mucosal surfaces were surveyed very carefully during advancement of the scope and upon withdrawal.  Retroflexion view of the proximal stomach and esophagogastric junction was performed.      FINDINGS:  Distal esophageal erosions within 5 mm of GE junction. No Barrett's esophagus. Stomach empty. Small hernia. Scattered gastric erosions. One area of stellate scar in the gastric antrum. No ulcer or infiltrating process. Patent pylorus. Normal first and second portion of the duodenum.  THERAPEUTIC / DIAGNOSTIC MANEUVERS PERFORMED: Biopsies of the abnormal gastric antrum taken for histologic study   COMPLICATIONS:  None  IMPRESSION:   Mild erosive reflux esophagitis. Hiatal hernia. Gastric erosions and sca r -the latter findings suggestive of prior peptic ulcer disease. Status post gastric biopsy.  RECOMMENDATIONS:   Begin Protonix 40 mg daily. Minimize use of nonsteroidal drugs. Followup  on pathology. Followup with Dr. Annabell Howells regarding workup and further evaluation of renal lesions    _______________________________ R. Roetta Sessions, MD FACP [facility] eSigned:  R. Roetta Sessions, MD FACP [facility] 03/21/2013 3:12 PM     CC:  PATIENT NAME:  Colin Oliver, Colin Oliver MR#: [id number]

## 2013-03-25 ENCOUNTER — Encounter: Payer: Self-pay | Admitting: Internal Medicine

## 2013-03-26 ENCOUNTER — Encounter (HOSPITAL_COMMUNITY): Payer: Self-pay | Admitting: Internal Medicine

## 2013-04-06 ENCOUNTER — Encounter: Payer: Self-pay | Admitting: Internal Medicine

## 2013-04-06 ENCOUNTER — Ambulatory Visit (INDEPENDENT_AMBULATORY_CARE_PROVIDER_SITE_OTHER): Payer: BC Managed Care – PPO | Admitting: Internal Medicine

## 2013-04-06 VITALS — BP 124/74 | HR 65 | Temp 96.8°F | Ht 72.0 in | Wt 140.0 lb

## 2013-04-06 DIAGNOSIS — K219 Gastro-esophageal reflux disease without esophagitis: Secondary | ICD-10-CM

## 2013-04-06 NOTE — Patient Instructions (Addendum)
Continue Protonix daily   GERD information provided  Office visit in 6 months
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
## 2013-04-18 ENCOUNTER — Encounter (HOSPITAL_BASED_OUTPATIENT_CLINIC_OR_DEPARTMENT_OTHER): Payer: Self-pay | Admitting: *Deleted
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
## 2013-04-26 ENCOUNTER — Ambulatory Visit (HOSPITAL_BASED_OUTPATIENT_CLINIC_OR_DEPARTMENT_OTHER): Payer: BC Managed Care – PPO | Admitting: Anesthesiology

## 2013-04-26 ENCOUNTER — Encounter (HOSPITAL_BASED_OUTPATIENT_CLINIC_OR_DEPARTMENT_OTHER)
Admission: RE | Disposition: A | Discharge status: Home / Self Care | Payer: Self-pay | Source: Ambulatory Visit | Attending: Orthopedic Surgery

## 2013-04-26 ENCOUNTER — Encounter (HOSPITAL_BASED_OUTPATIENT_CLINIC_OR_DEPARTMENT_OTHER): Payer: Self-pay | Admitting: Anesthesiology

## 2013-04-26 ENCOUNTER — Ambulatory Visit (HOSPITAL_BASED_OUTPATIENT_CLINIC_OR_DEPARTMENT_OTHER)
Admission: RE | Admit: 2013-04-26 | Discharge: 2013-04-26 | Disposition: A | Discharge status: Home / Self Care | Payer: BC Managed Care – PPO | Source: Ambulatory Visit | Attending: Orthopedic Surgery | Admitting: Orthopedic Surgery

## 2013-04-26 ENCOUNTER — Encounter (HOSPITAL_BASED_OUTPATIENT_CLINIC_OR_DEPARTMENT_OTHER): Payer: Self-pay | Admitting: *Deleted

## 2013-04-26 DIAGNOSIS — K219 Gastro-esophageal reflux disease without esophagitis: Secondary | ICD-10-CM | POA: Insufficient documentation

## 2013-04-26 DIAGNOSIS — M25819 Other specified joint disorders, unspecified shoulder: Secondary | ICD-10-CM | POA: Insufficient documentation

## 2013-04-26 DIAGNOSIS — M24419 Recurrent dislocation, unspecified shoulder: Secondary | ICD-10-CM | POA: Insufficient documentation

## 2013-04-26 DIAGNOSIS — M25311 Other instability, right shoulder: Secondary | ICD-10-CM

## 2013-04-26 DIAGNOSIS — Y92410 Unspecified street and highway as the place of occurrence of the external cause: Secondary | ICD-10-CM | POA: Insufficient documentation

## 2013-04-26 DIAGNOSIS — F17200 Nicotine dependence, unspecified, uncomplicated: Secondary | ICD-10-CM | POA: Insufficient documentation

## 2013-04-26 DIAGNOSIS — K449 Diaphragmatic hernia without obstruction or gangrene: Secondary | ICD-10-CM | POA: Insufficient documentation

## 2013-04-26 DIAGNOSIS — F3289 Other specified depressive episodes: Secondary | ICD-10-CM | POA: Insufficient documentation

## 2013-04-26 DIAGNOSIS — F329 Major depressive disorder, single episode, unspecified: Secondary | ICD-10-CM | POA: Insufficient documentation

## 2013-04-26 DIAGNOSIS — Z87828 Personal history of other (healed) physical injury and trauma: Secondary | ICD-10-CM | POA: Insufficient documentation

## 2013-04-26 DIAGNOSIS — F411 Generalized anxiety disorder: Secondary | ICD-10-CM | POA: Insufficient documentation

## 2013-04-26 DIAGNOSIS — IMO0002 Reserved for concepts with insufficient information to code with codable children: Secondary | ICD-10-CM | POA: Insufficient documentation

## 2013-04-26 HISTORY — DX: Gastro-esophageal reflux disease without esophagitis: K21.9

## 2013-04-26 HISTORY — DX: Depression, unspecified: F32.A

## 2013-04-26 HISTORY — DX: Anxiety disorder, unspecified: F41.9

## 2013-04-26 HISTORY — PX: SHOULDER ARTHROSCOPY WITH BANKART REPAIR: SHX5673

## 2013-04-26 HISTORY — DX: Major depressive disorder, single episode, unspecified: F32.9

## 2013-04-26 HISTORY — DX: Unspecified dislocation of unspecified shoulder joint, initial encounter: S43.006A

## 2013-04-26 SURGERY — SHOULDER ARTHROSCOPY WITH BANKART REPAIR
Anesthesia: General | Site: Shoulder | Laterality: Right | Wound class: Clean

## 2013-04-26 MED ORDER — FENTANYL CITRATE 0.05 MG/ML IJ SOLN
50.0000 ug | Freq: Once | INTRAMUSCULAR | Status: AC
  Administered 2013-04-26: 100 ug via INTRAVENOUS

## 2013-04-26 MED ORDER — MIDAZOLAM HCL 2 MG/2ML IJ SOLN
1.0000 mg | INTRAMUSCULAR | Status: DC | PRN
  Administered 2013-04-26: 2 mg via INTRAVENOUS

## 2013-04-26 MED ORDER — DEXAMETHASONE SODIUM PHOSPHATE 4 MG/ML IJ SOLN
INTRAMUSCULAR | Status: DC | PRN
  Administered 2013-04-26: 4 mg

## 2013-04-26 MED ORDER — ACETAMINOPHEN 500 MG PO TABS
1000.0000 mg | ORAL_TABLET | Freq: Once | ORAL | Status: AC
  Administered 2013-04-26: 975 mg via ORAL

## 2013-04-26 MED ORDER — PROPOFOL 10 MG/ML IV BOLUS
INTRAVENOUS | Status: DC | PRN
  Administered 2013-04-26: 250 mg via INTRAVENOUS

## 2013-04-26 MED ORDER — DEXTROSE-NACL 5-0.45 % IV SOLN: INTRAVENOUS | Status: DC

## 2013-04-26 MED ORDER — OXYCODONE HCL 5 MG/5ML PO SOLN: 5.0000 mg | Freq: Once | ORAL | Status: DC | PRN

## 2013-04-26 MED ORDER — LIDOCAINE HCL (CARDIAC) 20 MG/ML IV SOLN
INTRAVENOUS | Status: DC | PRN
  Administered 2013-04-26: 80 mg via INTRAVENOUS

## 2013-04-26 MED ORDER — CHLORHEXIDINE GLUCONATE 4 % EX LIQD: 60.0000 mL | Freq: Once | CUTANEOUS | Status: DC

## 2013-04-26 MED ORDER — LACTATED RINGERS IV SOLN
INTRAVENOUS | Status: DC
  Administered 2013-04-26: 07:00:00 via INTRAVENOUS

## 2013-04-26 MED ORDER — HYDROMORPHONE HCL PF 1 MG/ML IJ SOLN: 0.2500 mg | INTRAMUSCULAR | Status: DC | PRN

## 2013-04-26 MED ORDER — OXYCODONE HCL 5 MG PO TABS: 5.0000 mg | ORAL_TABLET | Freq: Once | ORAL | Status: DC | PRN

## 2013-04-26 MED ORDER — PROMETHAZINE HCL 25 MG/ML IJ SOLN: 6.2500 mg | INTRAMUSCULAR | Status: DC | PRN

## 2013-04-26 MED ORDER — ONDANSETRON HCL 4 MG/2ML IJ SOLN
INTRAMUSCULAR | Status: DC | PRN
  Administered 2013-04-26: 4 mg via INTRAVENOUS

## 2013-04-26 MED ORDER — SUCCINYLCHOLINE CHLORIDE 20 MG/ML IJ SOLN
INTRAMUSCULAR | Status: DC | PRN
  Administered 2013-04-26: 200 mg via INTRAVENOUS

## 2013-04-26 MED ORDER — SODIUM CHLORIDE 0.9 % IR SOLN
Status: DC | PRN
  Administered 2013-04-26: 6000 mL

## 2013-04-26 MED ORDER — CEFAZOLIN SODIUM-DEXTROSE 2-3 GM-% IV SOLR
2.0000 g | INTRAVENOUS | Status: AC
  Administered 2013-04-26: 2 g via INTRAVENOUS

## 2013-04-26 MED ORDER — OXYCODONE-ACETAMINOPHEN 10-325 MG PO TABS: 1.0000 | ORAL_TABLET | ORAL | Status: AC | PRN

## 2013-04-26 MED ORDER — BUPIVACAINE-EPINEPHRINE PF 0.5-1:200000 % IJ SOLN
INTRAMUSCULAR | Status: DC | PRN
  Administered 2013-04-26: 25 mL

## 2013-04-26 MED ORDER — DEXAMETHASONE SODIUM PHOSPHATE 4 MG/ML IJ SOLN
INTRAMUSCULAR | Status: DC | PRN
  Administered 2013-04-26: 10 mg via INTRAVENOUS

## 2013-04-26 MED ORDER — MIDAZOLAM HCL 2 MG/2ML IJ SOLN: 1.0000 mg | INTRAMUSCULAR | Status: DC | PRN

## 2013-04-26 MED ORDER — FENTANYL CITRATE 0.05 MG/ML IJ SOLN: 50.0000 ug | INTRAMUSCULAR | Status: DC | PRN

## 2013-04-26 SURGICAL SUPPLY — 65 items
BENZOIN TINCTURE PRP APPL 2/3 (GAUZE/BANDAGES/DRESSINGS) IMPLANT
BLADE CUTTER GATOR 3.5 (BLADE) ×2 IMPLANT
BLADE CUTTER MENIS 5.5 (BLADE) IMPLANT
BLADE GREAT WHITE 4.2 (BLADE) ×2 IMPLANT
BLADE MINI RND TIP GREEN BEAV (BLADE) IMPLANT
BLADE SURG 15 STRL LF DISP TIS (BLADE) IMPLANT
BLADE SURG 15 STRL SS (BLADE)
BUR OVAL 6.0 (BURR) ×2 IMPLANT
CANISTER OMNI JUG 16 LITER (MISCELLANEOUS) ×2 IMPLANT
CANISTER SUCTION 2500CC (MISCELLANEOUS) IMPLANT
CANNULA 5.75X71 LONG (CANNULA) IMPLANT
CANNULA DRY DOC 8X75 (CANNULA) IMPLANT
CANNULA TWIST IN 8.25X7CM (CANNULA) IMPLANT
CANNULA TWIST IN 8.25X9CM (CANNULA) IMPLANT
CLOTH BEACON ORANGE TIMEOUT ST (SAFETY) ×2 IMPLANT
DECANTER SPIKE VIAL GLASS SM (MISCELLANEOUS) IMPLANT
DRAPE STERI 35X30 U-POUCH (DRAPES) ×2 IMPLANT
DRAPE U-SHAPE 47X51 STRL (DRAPES) ×2 IMPLANT
DRAPE U-SHAPE 76X120 STRL (DRAPES) ×4 IMPLANT
DRSG PAD ABDOMINAL 8X10 ST (GAUZE/BANDAGES/DRESSINGS) ×2 IMPLANT
DURAPREP 26ML APPLICATOR (WOUND CARE) ×2 IMPLANT
ELECT MENISCUS 165MM 90D (ELECTRODE) ×2 IMPLANT
ELECT REM PT RETURN 9FT ADLT (ELECTROSURGICAL) ×2
ELECTRODE REM PT RTRN 9FT ADLT (ELECTROSURGICAL) ×1 IMPLANT
GAUZE SPONGE 4X4 16PLY XRAY LF (GAUZE/BANDAGES/DRESSINGS) IMPLANT
GAUZE XEROFORM 1X8 LF (GAUZE/BANDAGES/DRESSINGS) ×2 IMPLANT
GLOVE ORTHO TXT STRL SZ7.5 (GLOVE) ×2 IMPLANT
GOWN PREVENTION PLUS XLARGE (GOWN DISPOSABLE) ×2 IMPLANT
GOWN STRL REIN 2XL XLG LVL4 (GOWN DISPOSABLE) ×2 IMPLANT
IMMOBILIZER SHOULDER XLGE (ORTHOPEDIC SUPPLIES) IMPLANT
LOOP 2 FIBERLINK CLOSED (SUTURE) IMPLANT
NDL SUT 6 .5 CRC .975X.05 MAYO (NEEDLE) IMPLANT
NEEDLE MAYO TAPER (NEEDLE)
NS IRRIG 1000ML POUR BTL (IV SOLUTION) IMPLANT
PACK ARTHROSCOPY DSU (CUSTOM PROCEDURE TRAY) ×2 IMPLANT
PACK BASIN DAY SURGERY FS (CUSTOM PROCEDURE TRAY) ×2 IMPLANT
PENCIL BUTTON HOLSTER BLD 10FT (ELECTRODE) ×2 IMPLANT
SET ARTHROSCOPY TUBING (MISCELLANEOUS) ×2
SET ARTHROSCOPY TUBING LN (MISCELLANEOUS) ×1 IMPLANT
SLEEVE SCD COMPRESS KNEE MED (MISCELLANEOUS) IMPLANT
SLING ARM FOAM STRAP LRG (SOFTGOODS) IMPLANT
SLING ARM FOAM STRAP MED (SOFTGOODS) IMPLANT
SLING ARM FOAM STRAP XLG (SOFTGOODS) IMPLANT
SLING ARM IMMOBILIZER LRG (SOFTGOODS) IMPLANT
SLING ARM IMMOBILIZER MED (SOFTGOODS) IMPLANT
SPONGE GAUZE 4X4 12PLY (GAUZE/BANDAGES/DRESSINGS) ×2 IMPLANT
SPONGE LAP 4X18 X RAY DECT (DISPOSABLE) IMPLANT
STRIP CLOSURE SKIN 1/2X4 (GAUZE/BANDAGES/DRESSINGS) IMPLANT
SUCTION FRAZIER TIP 10 FR DISP (SUCTIONS) IMPLANT
SUT ETHIBOND 2 OS 4 DA (SUTURE) IMPLANT
SUT ETHILON 2 0 FS 18 (SUTURE) IMPLANT
SUT ETHILON 3 0 PS 1 (SUTURE) IMPLANT
SUT FIBERWIRE #2 38 T-5 BLUE (SUTURE) ×4
SUT RETRIEVER MED (INSTRUMENTS) IMPLANT
SUT VIC AB 0 CT1 27 (SUTURE)
SUT VIC AB 0 CT1 27XBRD ANBCTR (SUTURE) IMPLANT
SUT VIC AB 2-0 SH 27 (SUTURE)
SUT VIC AB 2-0 SH 27XBRD (SUTURE) IMPLANT
SUT VIC AB 3-0 FS2 27 (SUTURE) IMPLANT
SUTURE FIBERWR #2 38 T-5 BLUE (SUTURE) ×2 IMPLANT
SYR BULB 3OZ (MISCELLANEOUS) IMPLANT
TOWEL OR 17X24 6PK STRL BLUE (TOWEL DISPOSABLE) ×2 IMPLANT
TUBE CONNECTING 20X1/4 (TUBING) IMPLANT
WATER STERILE IRR 1000ML POUR (IV SOLUTION) ×2 IMPLANT
YANKAUER SUCT BULB TIP NO VENT (SUCTIONS) IMPLANT

## 2013-04-26 NOTE — Anesthesia Preprocedure Evaluation (Signed)
Anesthesia Evaluation  Patient identified by MRN, date of birth, ID band Patient awake    Reviewed: Allergy & Precautions, H&P , NPO status , Patient's Chart, lab work & pertinent test results  Airway Mallampati: I TM Distance: >3 FB Neck ROM: Full    Dental   Pulmonary Current Smoker,  breath sounds clear to auscultation        Cardiovascular Rhythm:Regular Rate:Normal     Neuro/Psych Anxiety Depression    GI/Hepatic hiatal hernia, GERD-  ,  Endo/Other    Renal/GU      Musculoskeletal   Abdominal   Peds  Hematology   Anesthesia Other Findings   Reproductive/Obstetrics                           Anesthesia Physical Anesthesia Plan  ASA: II  Anesthesia Plan: General   Post-op Pain Management:    Induction: Intravenous  Airway Management Planned: Oral ETT  Additional Equipment:   Intra-op Plan:   Post-operative Plan: Extubation in OR  Informed Consent: I have reviewed the patients History and Physical, chart, labs and discussed the procedure including the risks, benefits and alternatives for the proposed anesthesia with the patient or authorized representative who has indicated his/her understanding and acceptance.     Plan Discussed with: CRNA and Surgeon  Anesthesia Plan Comments:         Anesthesia Quick Evaluation

## 2013-04-26 NOTE — Transfer of Care (Signed)
Immediate Anesthesia Transfer of Care Note  Patient: Colin Oliver  Procedure(s) Performed: Procedure(s) with comments: SHOULDER ARTHROSCOPY WITH BANKHARDT REPAIR vs OPEN CAPSULORRHAPHY (Right) - Scope,debridement of labrium, open capsulorrhaphy  Patient Location: PACU  Anesthesia Type:General  Level of Consciousness: awake and sedated  Airway & Oxygen Therapy: Patient Spontanous Breathing and Patient connected to face mask oxygen  Post-op Assessment: Report given to PACU RN and Post -op Vital signs reviewed and stable  Post vital signs: Reviewed and stable  Complications: No apparent anesthesia complications

## 2013-04-26 NOTE — Interval H&P Note (Signed)
History and Physical Interval Note:  04/26/2013 7:26 AM  Colin Oliver  has presented today for surgery, with the diagnosis of RIGHT SHOULDER DISLOCATION/SUBLUXATION, RECURRENT IMPINGEMENT SYNDROME  The various methods of treatment have been discussed with the patient and family. After consideration of risks, benefits and other options for treatment, the patient has consented to  Procedure(s): SHOULDER ARTHROSCOPY WITH [facility] REPAIR vs OPEN CAPSULORRHAPHY (Right) as a surgical intervention .  The patient's history has been reviewed, patient examined, no change in status, stable for surgery.  I have reviewed the patient's chart and labs.  Questions were answered to the patient's satisfaction.     Mailani Degroote F

## 2013-04-26 NOTE — Anesthesia Procedure Notes (Addendum)
Anesthesia Regional Block:  Interscalene brachial plexus block  Pre-Anesthetic Checklist: ,, timeout performed, Correct Patient, Correct Site, Correct Laterality, Correct Procedure, Correct Position, site marked, Risks and benefits discussed,  Surgical consent,  Pre-op evaluation,  At surgeon's request and post-op pain management  Laterality: Right  Prep: chloraprep       Needles:  Injection technique: Single-shot  Needle Type: Echogenic Stimulator Needle     Needle Length: 5cm 5 cm Needle Gauge: 22 and 22 G    Additional Needles:  Procedures: ultrasound guided (picture in chart) and nerve stimulator Interscalene brachial plexus block  Nerve Stimulator or Paresthesia:  Response: 0.48 mA,   Additional Responses:   Narrative:  Start time: 04/26/2013 6:58 AM End time: 04/26/2013 7:08 AM Injection made incrementally with aspirations every 5 mL. Anesthesiologist: Dr Gypsy Balsam  Additional Notes: 4098-1191 R ISB POP CHG prep, sterile tech #22 stim/echo needle with good [place] visualization-PIX in chart and stim down to .48ma Multiple neg asp Marc .5% w/epi 1:200000 total 25cc+decadron 4mg  infiltrated No compl Dr Gypsy Balsam   Procedure Name: Intubation Performed by: York Grice Pre-anesthesia Checklist: Patient identified, Timeout performed, Emergency Drugs available, Suction available and Patient being monitored Patient Re-evaluated:Patient Re-evaluated prior to inductionOxygen Delivery Method: Circle system utilized Preoxygenation: Pre-oxygenation with 100% oxygen Intubation Type: IV induction Ventilation: Mask ventilation without difficulty Laryngoscope Size: Miller and 2 Grade View: Grade I Tube type: Oral Number of attempts: 1 Airway Equipment and Method: Stylet Placement Confirmation: breath sounds checked- equal and bilateral and positive ETCO2 Secured at: 22 cm Tube secured with: Tape Dental Injury: Teeth and Oropharynx as per pre-operative assessment

## 2013-04-26 NOTE — Progress Notes (Signed)
Assisted Dr. Kasik with right, ultrasound guided, interscalene  block. Side rails up, monitors on throughout procedure. See vital signs in flow sheet. Tolerated Procedure well. 

## 2013-04-26 NOTE — Anesthesia Postprocedure Evaluation (Signed)
  Anesthesia Post-op Note  Patient: Colin Oliver  Procedure(s) Performed: Procedure(s) with comments: SHOULDER ARTHROSCOPY WITH BANKHARDT REPAIR vs OPEN CAPSULORRHAPHY (Right) - Scope,debridement of labrium, open capsulorrhaphy  Patient Location: PACU  Anesthesia Type:GA combined with regional for post-op pain  Level of Consciousness: awake and alert   Airway and Oxygen Therapy: Patient Spontanous Breathing  Post-op Pain: none  Post-op Assessment: Post-op Vital signs reviewed, Patient's Cardiovascular Status Stable, Respiratory Function Stable, Patent Airway, No signs of Nausea or vomiting and Pain level controlled  Post-op Vital Signs: stable  Complications: No apparent anesthesia complications

## 2013-04-27 ENCOUNTER — Encounter (HOSPITAL_BASED_OUTPATIENT_CLINIC_OR_DEPARTMENT_OTHER): Payer: Self-pay | Admitting: Orthopedic Surgery
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
## 2013-05-11 ENCOUNTER — Ambulatory Visit: Payer: BC Managed Care – PPO | Admitting: Urology

## 2013-11-22 ENCOUNTER — Other Ambulatory Visit: Payer: Self-pay

## 2013-11-22 MED ORDER — PANTOPRAZOLE SODIUM 40 MG PO TBEC: 40.0000 mg | DELAYED_RELEASE_TABLET | Freq: Every day | ORAL | Status: AC

## 2014-12-04 ENCOUNTER — Encounter: Payer: Self-pay | Admitting: Internal Medicine

## 2015-01-21 ENCOUNTER — Ambulatory Visit: Payer: Self-pay | Admitting: Nurse Practitioner

## 2016-07-07 ENCOUNTER — Ambulatory Visit: Payer: BLUE CROSS/BLUE SHIELD | Admitting: Neurology

## 2016-07-21 ENCOUNTER — Encounter: Payer: Self-pay | Admitting: Neurology

## 2016-07-21 ENCOUNTER — Ambulatory Visit (INDEPENDENT_AMBULATORY_CARE_PROVIDER_SITE_OTHER): Payer: Self-pay | Admitting: Neurology

## 2016-07-21 ENCOUNTER — Ambulatory Visit (INDEPENDENT_AMBULATORY_CARE_PROVIDER_SITE_OTHER): Payer: BLUE CROSS/BLUE SHIELD | Admitting: Neurology

## 2016-07-21 DIAGNOSIS — R112 Nausea with vomiting, unspecified: Secondary | ICD-10-CM

## 2016-07-21 DIAGNOSIS — R202 Paresthesia of skin: Secondary | ICD-10-CM | POA: Diagnosis not present

## 2016-07-21 DIAGNOSIS — M79601 Pain in right arm: Secondary | ICD-10-CM

## 2016-07-21 DIAGNOSIS — M79602 Pain in left arm: Secondary | ICD-10-CM

## 2016-07-21 NOTE — Procedures (Signed)
     HISTORY:  Colin Oliver is a 30 year old gentleman with a history of bilateral shoulder surgeries. Over the last 2 or 3 years he has noted discomfort around the shoulders on both sides, some pain from the neck level down arms bilaterally. If he raises the arms up above shoulder level, he will get paresthesias that go down the arms into the hands within about 20 seconds. He denies any weakness of the arms. The patient is being evaluated for a possible neuropathy or a cervical radiculopathy.  NERVE CONDUCTION STUDIES:  Nerve conduction studies were performed on both upper extremities. The distal motor latencies and motor amplitudes for the median and ulnar nerves were within normal limits. The F wave latencies and nerve conduction velocities for these nerves were also normal. The sensory latencies for the median and ulnar nerves were normal.   EMG STUDIES:  EMG study was performed on the right upper extremity:  The first dorsal interosseous muscle reveals 2 to 4 K units with full recruitment. No fibrillations or positive waves were noted. The abductor pollicis brevis muscle reveals 2 to 4 K units with full recruitment. No fibrillations or positive waves were noted. The extensor indicis proprius muscle reveals 1 to 3 K units with full recruitment. No fibrillations or positive waves were noted. The pronator teres muscle reveals 2 to 3 K units with full recruitment. No fibrillations or positive waves were noted. The biceps muscle reveals 1 to 2 K units with full recruitment. No fibrillations or positive waves were noted. The triceps muscle reveals 2 to 4 K units with full recruitment. No fibrillations or positive waves were noted. The anterior deltoid muscle reveals 2 to 3 K units with full recruitment. No fibrillations or positive waves were noted. The cervical paraspinal muscles were tested at 2 levels. No abnormalities of insertional activity were seen at either level tested. There  was good relaxation.  EMG study was performed on the left upper extremity:  The first dorsal interosseous muscle reveals 2 to 4 K units with full recruitment. No fibrillations or positive waves were noted. The abductor pollicis brevis muscle reveals 2 to 4 K units with full recruitment. No fibrillations or positive waves were noted. The extensor indicis proprius muscle reveals 1 to 3 K units with full recruitment. No fibrillations or positive waves were noted. The pronator teres muscle reveals 2 to 3 K units with full recruitment. No fibrillations or positive waves were noted. The biceps muscle reveals 1 to 2 K units with full recruitment. No fibrillations or positive waves were noted. The triceps muscle reveals 2 to 4 K units with full recruitment. No fibrillations or positive waves were noted. The anterior deltoid muscle reveals 2 to 3 K units with full recruitment. No fibrillations or positive waves were noted. The cervical paraspinal muscles were tested at 2 levels. No abnormalities of insertional activity were seen at either level tested. There was good relaxation.   IMPRESSION:  Nerve conduction studies done on both upper extremities were within normal limits. No evidence of a neuropathy is seen. EMG evaluation of both upper extremities is unremarkable, without evidence of an overlying cervical radiculopathy or brachial plexopathy.  [name]. [name] MD 07/21/2016 9:31 AM  [facility] [address] [place], [place] [id number]  Phone [phone] Fax [phone]

## 2016-07-21 NOTE — Progress Notes (Signed)
Please refer to EMG and nerve conduction study procedure note. 

## 2016-07-28 ENCOUNTER — Telehealth: Payer: Self-pay

## 2016-07-28 ENCOUNTER — Ambulatory Visit: Payer: BLUE CROSS/BLUE SHIELD | Admitting: Neurology

## 2016-07-28 NOTE — Telephone Encounter (Signed)
Pt no-showed his new pt appt this morning.

## 2016-08-02 ENCOUNTER — Encounter: Payer: Self-pay | Admitting: Neurology

## 2016-11-02 ENCOUNTER — Other Ambulatory Visit: Payer: Self-pay | Admitting: Orthopedic Surgery

## 2016-11-02 DIAGNOSIS — S6981XA Other specified injuries of right wrist, hand and finger(s), initial encounter: Secondary | ICD-10-CM

## 2016-11-18 ENCOUNTER — Ambulatory Visit
Admission: RE | Admit: 2016-11-18 | Discharge: 2016-11-18 | Disposition: A | Discharge status: Home / Self Care | Payer: BLUE CROSS/BLUE SHIELD | Source: Ambulatory Visit | Attending: Orthopedic Surgery | Admitting: Orthopedic Surgery

## 2016-11-18 DIAGNOSIS — S6981XA Other specified injuries of right wrist, hand and finger(s), initial encounter: Secondary | ICD-10-CM

## 2016-11-18 MED ORDER — IOPAMIDOL (ISOVUE-M 200) INJECTION 41%: 1.5000 mL | Freq: Once | INTRAMUSCULAR | Status: DC

## 2020-05-15 ENCOUNTER — Other Ambulatory Visit (HOSPITAL_COMMUNITY): Payer: Self-pay | Admitting: Internal Medicine

## 2020-05-15 ENCOUNTER — Ambulatory Visit (HOSPITAL_COMMUNITY)
Admission: RE | Admit: 2020-05-15 | Discharge: 2020-05-15 | Disposition: A | Discharge status: Home / Self Care | Payer: BC Managed Care – PPO | Source: Ambulatory Visit | Attending: Internal Medicine | Admitting: Internal Medicine

## 2020-05-15 ENCOUNTER — Other Ambulatory Visit: Payer: Self-pay

## 2020-05-15 DIAGNOSIS — M545 Low back pain, unspecified: Secondary | ICD-10-CM

## 2020-05-15 DIAGNOSIS — R079 Chest pain, unspecified: Secondary | ICD-10-CM

## 2020-05-15 DIAGNOSIS — M79621 Pain in right upper arm: Secondary | ICD-10-CM | POA: Diagnosis present

## 2020-05-15 DIAGNOSIS — M546 Pain in thoracic spine: Secondary | ICD-10-CM | POA: Diagnosis present

## 2020-05-15 DIAGNOSIS — M542 Cervicalgia: Secondary | ICD-10-CM | POA: Diagnosis present
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
## 2021-11-10 ENCOUNTER — Encounter (HOSPITAL_COMMUNITY): Payer: Self-pay | Admitting: Emergency Medicine

## 2021-11-10 ENCOUNTER — Other Ambulatory Visit: Payer: Self-pay

## 2021-11-10 ENCOUNTER — Emergency Department (HOSPITAL_COMMUNITY)
Admission: EM | Admit: 2021-11-10 | Discharge: 2021-11-10 | Disposition: A | Discharge status: Home / Self Care | Payer: BC Managed Care – PPO | Attending: Emergency Medicine | Admitting: Emergency Medicine

## 2021-11-10 DIAGNOSIS — S61210A Laceration without foreign body of right index finger without damage to nail, initial encounter: Secondary | ICD-10-CM | POA: Insufficient documentation

## 2021-11-10 DIAGNOSIS — W268XXA Contact with other sharp object(s), not elsewhere classified, initial encounter: Secondary | ICD-10-CM | POA: Insufficient documentation

## 2021-11-10 MED ORDER — LIDOCAINE HCL (PF) 1 % IJ SOLN
5.0000 mL | Freq: Once | INTRAMUSCULAR | Status: AC
  Filled 2021-11-10: qty 5

## 2021-11-10 MED ORDER — LIDOCAINE HCL (PF) 1 % IJ SOLN
INTRAMUSCULAR | Status: AC
  Administered 2021-11-10: 5 mL
  Filled 2021-11-10: qty 5
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
## 2021-11-10 NOTE — ED Provider Notes (Signed)
?[place] DEPARTMENT ?Provider Note ? ? ?CSN: [id number] ?Arrival date & time: 11/10/21  0417 ? ?  ? ?History ? ?Chief Complaint  ?Patient presents with  ? Laceration  ? ? ?Colin Oliver is a 36 y.o. male. ? ?Patient presents to ER for evaluation of laceration.  Patient reports that he cut the back of his right index finger on a piece of metal just prior to arrival.  Patient reports a fair amount of bleeding, not controlled with the dressing he applied prehospital.  Tetanus is up-to-date ? ? ?  ? ?Home Medications ?Prior to Admission medications   ?Medication Sig Start Date End Date Taking? Authorizing Provider  ?clonazePAM (KLONOPIN) 1 MG tablet Take 1 mg by mouth 3 (three) times daily as needed for anxiety.     [provider]  ?fish oil-omega-3 fatty acids 1000 MG capsule Take 2 g by mouth daily.    [provider]  ?Multiple Vitamins-Minerals (MULTIVITAMIN WITH MINERALS) tablet Take 1 tablet by mouth daily.    [provider]  ?oxyCODONE-acetaminophen (PERCOCET) 10-325 MG per tablet Take 1 tablet by mouth every 4 (four) hours as needed for pain. 04/26/13   Domingo Cocking, PA-C  ?pantoprazole (PROTONIX) 40 MG tablet Take 1 tablet (40 mg total) by mouth daily. 11/22/13   Tiffany Kocher, PA-C  ?sertraline (ZOLOFT) 50 MG tablet Take 50 mg by mouth daily.    [provider]  ?   ? ?Allergies    ?Relafen [nabumetone]   ? ?Review of Systems   ?Review of Systems  ?Skin:  Positive for wound.  ? ?Physical Exam ?Updated Vital Signs ?BP (!) 138/113   Pulse 80   Temp 98 ?F (36.7 ?C)   Resp 17   Ht 6' (1.829 m)   Wt 64.4 kg   SpO2 100%   BMI 19.26 kg/m?  ?Physical Exam ?Vitals and nursing note reviewed.  ?Constitutional:   ?   Appearance: Normal appearance.  ?Musculoskeletal:  ?   Right hand: Laceration (Proximal phalanx second digit, dorsal) present. Normal sensation. There is no disruption of two-point discrimination. Normal capillary refill.  ?   Comments: Normal  extension of right index finger  ?Skin: ?   Findings: Laceration (Right index finger) present.  ?Neurological:  ?   Mental Status: He is alert.  ?   Motor: Motor function is intact.  ?   Coordination: Coordination is intact.  ? ? ?ED Results / Procedures / Treatments   ?Labs ?(all labs ordered are listed, but only abnormal results are displayed) ?Labs Reviewed - No data to display ? ?EKG ?None ? ?Radiology ?No results found. ? ?Procedures ?Marland Kitchen.Laceration Repair ? ?Date/Time: 11/10/2021 6:07 AM ?Performed by: Gilda Crease, MD ?Authorized by: Gilda Crease, MD  ? ?Consent:  ?  Consent obtained:  Verbal ?  Consent given by:  Patient ?  Risks, benefits, and alternatives were discussed: yes   ?  Risks discussed:  Infection, pain and retained foreign body ?Universal protocol:  ?  Procedure explained and questions answered to patient or proxy's satisfaction: yes   ?  Site/side marked: yes   ?  Immediately prior to procedure, a time out was called: yes   ?  Patient identity confirmed:  Verbally with patient ?Anesthesia:  ?  Anesthesia method:  Local infiltration ?  Local anesthetic:  Lidocaine 1% w/o epi ?Laceration details:  ?  Location:  Finger ?  Finger location:  R index finger ?  Length (cm):  1.5 ?Pre-procedure details:  ?  Preparation:  Patient was prepped and draped in usual sterile fashion ?Exploration:  ?  Wound exploration: wound explored through full range of motion and entire depth of wound visualized   ?  Wound extent: no nerve damage noted and no tendon damage noted   ?  Contaminated: no   ?Treatment:  ?  Area cleansed with:  Chlorhexidine ?  Irrigation solution:  Sterile water ?  Irrigation volume:  400 ?  Irrigation method:  Syringe ?  Debridement:  None ?  Undermining:  None ?Skin repair:  ?  Repair method:  Sutures ?  Suture size:  4-0 ?  Suture material:  Prolene ?  Suture technique:  Simple interrupted ?  Number of sutures:  4 ?Approximation:  ?  Approximation:  Close ?Repair type:  ?   Repair type:  Simple ?Post-procedure details:  ?  Procedure completion:  Tolerated well, no immediate complications  ? ? ?Medications Ordered in ED ?Medications  ?lidocaine (PF) (XYLOCAINE) 1 % injection 5 mL (5 mLs Infiltration Given 11/10/21 0551)  ? ? ?ED Course/ Medical Decision Making/ A&P ?  ?                        ?Medical Decision Making ?Risk ?Prescription drug management. ? ? ?Presents with laceration to finger.  Patient's tetanus is up-to-date.  Wound explored, no evidence of tendon or nerve involvement.  Laceration repaired with sutures, suture removal in 10 days. ? ? ? ? ? ? ? ?Final Clinical Impression(s) / ED Diagnoses ?Final diagnoses:  ?Laceration of right index finger without foreign body without damage to nail, initial encounter  ? ? ?Rx / DC Orders ?ED Discharge Orders   ? ? None  ? ?  ? ? ?  ?Gilda Crease, MD ?11/10/21 [phone] ? ?

## 2022-03-30 DIAGNOSIS — Z419 Encounter for procedure for purposes other than remedying health state, unspecified: Secondary | ICD-10-CM | POA: Diagnosis not present

## 2022-04-30 DIAGNOSIS — Z419 Encounter for procedure for purposes other than remedying health state, unspecified: Secondary | ICD-10-CM | POA: Diagnosis not present

## 2022-05-07 ENCOUNTER — Encounter (HOSPITAL_COMMUNITY): Payer: Self-pay | Admitting: *Deleted

## 2022-05-07 ENCOUNTER — Other Ambulatory Visit: Payer: Self-pay

## 2022-05-07 ENCOUNTER — Emergency Department (HOSPITAL_COMMUNITY)
Admission: EM | Admit: 2022-05-07 | Discharge: 2022-05-07 | Disposition: A | Discharge status: Home / Self Care | Payer: Medicaid Other | Attending: Emergency Medicine | Admitting: Emergency Medicine

## 2022-05-07 DIAGNOSIS — M25511 Pain in right shoulder: Secondary | ICD-10-CM | POA: Diagnosis not present

## 2022-05-07 DIAGNOSIS — R519 Headache, unspecified: Secondary | ICD-10-CM | POA: Insufficient documentation

## 2022-05-07 DIAGNOSIS — Y92410 Unspecified street and highway as the place of occurrence of the external cause: Secondary | ICD-10-CM | POA: Insufficient documentation

## 2022-05-07 DIAGNOSIS — Z5321 Procedure and treatment not carried out due to patient leaving prior to being seen by health care provider: Secondary | ICD-10-CM | POA: Insufficient documentation

## 2022-05-07 DIAGNOSIS — S0101XA Laceration without foreign body of scalp, initial encounter: Secondary | ICD-10-CM | POA: Diagnosis not present

## 2022-05-07 DIAGNOSIS — M25532 Pain in left wrist: Secondary | ICD-10-CM | POA: Insufficient documentation

## 2022-05-07 NOTE — ED Notes (Signed)
Father at side of pt in triage, states they are are going to leave due to wait time.
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
## 2022-05-21 ENCOUNTER — Encounter: Payer: Self-pay | Admitting: Emergency Medicine

## 2022-05-21 ENCOUNTER — Ambulatory Visit
Admission: EM | Admit: 2022-05-21 | Discharge: 2022-05-21 | Disposition: A | Discharge status: Home / Self Care | Payer: Medicaid Other

## 2022-05-21 DIAGNOSIS — S0101XD Laceration without foreign body of scalp, subsequent encounter: Secondary | ICD-10-CM | POA: Diagnosis not present

## 2022-05-21 DIAGNOSIS — Z4802 Encounter for removal of sutures: Secondary | ICD-10-CM

## 2022-05-21 NOTE — ED Notes (Signed)
5 staples removed from head.  Area healing well

## 2022-05-21 NOTE — ED Triage Notes (Signed)
Here for staple removal from head  

## 2022-05-25 ENCOUNTER — Encounter: Payer: Self-pay | Admitting: Emergency Medicine

## 2022-05-25 ENCOUNTER — Ambulatory Visit
Admission: EM | Admit: 2022-05-25 | Discharge: 2022-05-25 | Disposition: A | Discharge status: Home / Self Care | Payer: Medicaid Other | Attending: Nurse Practitioner | Admitting: Nurse Practitioner

## 2022-05-25 DIAGNOSIS — L03211 Cellulitis of face: Secondary | ICD-10-CM | POA: Diagnosis not present

## 2022-05-25 DIAGNOSIS — K047 Periapical abscess without sinus: Secondary | ICD-10-CM | POA: Diagnosis not present

## 2022-05-25 MED ORDER — TRAMADOL HCL 50 MG PO TABS: 50.0000 mg | ORAL_TABLET | Freq: Four times a day (QID) | ORAL | 0 refills | Status: AC | PRN

## 2022-05-25 MED ORDER — CLINDAMYCIN HCL 300 MG PO CAPS: 300.0000 mg | ORAL_CAPSULE | Freq: Three times a day (TID) | ORAL | 0 refills | Status: AC

## 2022-05-25 MED ORDER — CEFTRIAXONE SODIUM 1 G IJ SOLR
1.0000 g | Freq: Once | INTRAMUSCULAR | Status: AC
  Administered 2022-05-25: 1 g via INTRAMUSCULAR

## 2022-05-25 NOTE — ED Triage Notes (Signed)
Upper dental pain on left side of mouth since last night.

## 2022-05-25 NOTE — Discharge Instructions (Addendum)
  Take medication as prescribed. May take over-the-counter Tylenol extra strength 1 to 2 hours after ibuprofen for breakthrough pain. Warm salt water gargles 3-4 times daily until symptoms improve. Continue warm compresses to the affected area to help with pain and discomfort. To the emergency department immediately if you have worsening facial swelling, jaw swelling, develop fever, chills, chest pain, nausea, vomiting, diarrhea, or other concerns. Follow-up with your dentist within the next 7 to 10 days for reevaluation.

## 2022-05-25 NOTE — ED Provider Notes (Signed)
[facility]    CSN: [id number] Arrival date & time: 05/25/22  1934      History   Chief Complaint No chief complaint on file.   HPI Colin Oliver is a 36 y.o. male.   The history is provided by the patient.   Presents for complaints of left-sided facial swelling is been present over the past 2 to 3 days.  Patient states symptoms worsened this morning upon waking.  He states that he had swelling to the left side of his face that caused his eye to become swollen shut.  He states he has numerous dental issues to include broken teeth, and caries.  Patient states that he has several teeth that have been broken for quite some time.  He states that he has had difficulty finding a dentist due to financial reasons caused by being out of work.  He states that he did reach out to the dentist earlier today but they could not see him, and also would not write a prescription for him because he was not seen.  Patient then went to the emergency department and waited for several hours, but ended up leaving due to the long wait.  Patient states that he has had a low-grade temperature around 99.  He denies chest pain, nausea, vomiting, or diarrhea. Past Medical History:  Diagnosis Date   Anxiety    Depression    GERD (gastroesophageal reflux disease)    Hiatal hernia    Reflux esophagitis    Shoulder dislocation    right   Stress     Patient Active Problem List   Diagnosis Date Noted   Heme positive stool 02/06/2013   Abdominal pain, epigastric 02/06/2013   Nausea with vomiting 02/06/2013    Past Surgical History:  Procedure Laterality Date   ESOPHAGOGASTRODUODENOSCOPY N/A 03/21/2013   Dr. Jena Gauss- mild erosive relfux esophagitis. hiatal hernia, gastric erosions and scar. bx-benign   HYDROCELE EXCISION     age 29   right shoulder  06/2012   SHOULDER ARTHROSCOPY WITH [facility] REPAIR Right 04/26/2013   Procedure: SHOULDER ARTHROSCOPY WITH Nira Conn REPAIR vs OPEN  CAPSULORRHAPHY;  Surgeon: Loreta Ave, MD;  Location: [facility];  Service: Orthopedics;  Laterality: Right;  Scope,debridement of labrium, open capsulorrhaphy       Home Medications    Prior to Admission medications   Medication Sig Start Date End Date Taking? Authorizing Provider  clindamycin (CLEOCIN) 300 MG capsule Take 1 capsule (300 mg total) by mouth 3 (three) times daily for 7 days. 05/25/22 06/01/22 Yes Annlouise Gerety-Warren, Sadie Haber, NP  traMADol (ULTRAM) 50 MG tablet Take 1 tablet (50 mg total) by mouth every 6 (six) hours as needed. 05/25/22  Yes Brianca Fortenberry-Warren, Sadie Haber, NP  clonazePAM (KLONOPIN) 1 MG tablet Take 1 mg by mouth 3 (three) times daily as needed for anxiety.     [provider]  fish oil-omega-3 fatty acids 1000 MG capsule Take 2 g by mouth daily.    [provider]  Multiple Vitamins-Minerals (MULTIVITAMIN WITH MINERALS) tablet Take 1 tablet by mouth daily.    [provider]  oxyCODONE-acetaminophen (PERCOCET) 10-325 MG per tablet Take 1 tablet by mouth every 4 (four) hours as needed for pain. 04/26/13   Domingo Cocking, PA-C  pantoprazole (PROTONIX) 40 MG tablet Take 1 tablet (40 mg total) by mouth daily. 11/22/13   Tiffany Kocher, PA-C  sertraline (ZOLOFT) 50 MG tablet Take 50 mg by mouth daily.  [provider]    Family History Family History  Problem Relation Age of Onset   Gallbladder disease Unknown        Multiple family members    Social History Social History   Tobacco Use   Smoking status: Every Day    Packs/day: 1.00    Years: 10.00    Total pack years: 10.00    Types: Cigarettes   Tobacco comments:    states he now uses vapor electronic cigs  Substance Use Topics   Alcohol use: Not Currently    Comment: socially, beer, 6 every other weekend, one to 2 a few days a week   Drug use: Not Currently     Allergies   Relafen [nabumetone]   Review of Systems Review of Systems Per  HPI  Physical Exam Triage Vital Signs ED Triage Vitals  Enc Vitals Group     BP 05/25/22 1938 134/76     Pulse Rate 05/25/22 1938 87     Resp 05/25/22 1938 16     Temp 05/25/22 1938 98.1 F (36.7 C)     Temp Source 05/25/22 1938 Oral     SpO2 05/25/22 1938 98 %     Weight --      Height --      Head Circumference --      Peak Flow --      Pain Score 05/25/22 1939 6     Pain Loc --      Pain Edu? --      Excl. in GC? --    No data found.  Updated Vital Signs BP 134/76 (BP Location: Right Arm)   Pulse 87   Temp 98.1 F (36.7 C) (Oral)   Resp 16   SpO2 98%   Visual Acuity Right Eye Distance:   Left Eye Distance:   Bilateral Distance:    Right Eye Near:   Left Eye Near:    Bilateral Near:     Physical Exam Vitals and nursing note reviewed.  Constitutional:      General: He is not in acute distress.    Appearance: Normal appearance.  HENT:     Head: Normocephalic.     Jaw: Tenderness, swelling (left jaw) and pain on movement (left jaw) present.     Right Ear: Tympanic membrane, ear canal and external ear normal.     Left Ear: Tympanic membrane, ear canal and external ear normal.     Nose: Nose normal.     Mouth/Throat:     Lips: Pink.     Dentition: Abnormal dentition. Dental tenderness, gingival swelling, dental caries and dental abscesses present.     Pharynx: Oropharynx is clear. Uvula midline.     Comments: Multiple dental caries noted throughout to the upper mouth bilaterally including dental fractures and missing teeth. Moderate gingival swelling noted.  Eyes:     Extraocular Movements: Extraocular movements intact.     Conjunctiva/sclera: Conjunctivae normal.  Cardiovascular:     Rate and Rhythm: Normal rate and regular rhythm.     Heart sounds: Normal heart sounds.  Pulmonary:     Effort: Pulmonary effort is normal. No respiratory distress.     Breath sounds: Normal breath sounds. No stridor. No wheezing, rhonchi or rales.  Abdominal:     General:  Bowel sounds are normal.     Palpations: Abdomen is soft.  Musculoskeletal:     Cervical back: Normal range of motion.  Lymphadenopathy:     Cervical: No  cervical adenopathy.  Skin:    General: Skin is warm and dry.  Neurological:     General: No focal deficit present.     Mental Status: He is alert and oriented to person, place, and time.  Psychiatric:        Mood and Affect: Mood normal.      UC Treatments / Results  Labs (all labs ordered are listed, but only abnormal results are displayed) Labs Reviewed - No data to display  EKG   Radiology No results found.  Procedures Procedures (including critical care time)  Medications Ordered in UC Medications  cefTRIAXone (ROCEPHIN) injection 1 g (1 g Intramuscular Given 05/25/22 1952)    Initial Impression / Assessment and Plan / UC Course  I have reviewed the triage vital signs and the nursing notes.  Pertinent labs & imaging results that were available during my care of the patient were reviewed by me and considered in my medical decision making (see chart for details).  Patient presents for complaints of left-sided facial swelling that been present for the past 2 to 3 days.  Symptoms have worsened over the past 24 hours.  On exam, patient has moderate left-sided facial swelling that has developed into facial cellulitis.  Patient was given Rocephin 1 g IM injection in the clinic this evening and started on clindamycin which should cover him for dental abscess and cellulitis.  Supportive care recommendations were provided to the patient along with strict indications of when to go to the emergency department.  Patient states he is scheduled to see his dentist within the next 7 to 10 days.  Patient was advised to follow-up as scheduled.  Patient verbalizes understanding.  All questions were answered. Final Clinical Impressions(s) / UC Diagnoses   Final diagnoses:  Facial cellulitis  Dental abscess     Discharge Instructions          Take medication as prescribed. May take over-the-counter Tylenol extra strength 1 to 2 hours after ibuprofen for breakthrough pain. Warm salt water gargles 3-4 times daily until symptoms improve. Continue warm compresses to the affected area to help with pain and discomfort. To the emergency department immediately if you have worsening facial swelling, jaw swelling, develop fever, chills, chest pain, nausea, vomiting, diarrhea, or other concerns. Follow-up with your dentist within the next 7 to 10 days for reevaluation.     ED Prescriptions     Medication Sig Dispense Auth. Provider   clindamycin (CLEOCIN) 300 MG capsule Take 1 capsule (300 mg total) by mouth 3 (three) times daily for 7 days. 21 capsule Shizuko Wojdyla-Warren, Alda Lea, NP   traMADol (ULTRAM) 50 MG tablet Take 1 tablet (50 mg total) by mouth every 6 (six) hours as needed. 10 tablet Marjon Doxtater-Warren, Alda Lea, NP      I have reviewed the PDMP during this encounter.   Tish Men, NP 05/25/22 2007

## 2022-05-30 DIAGNOSIS — Z419 Encounter for procedure for purposes other than remedying health state, unspecified: Secondary | ICD-10-CM | POA: Diagnosis not present

## 2022-06-30 DIAGNOSIS — Z419 Encounter for procedure for purposes other than remedying health state, unspecified: Secondary | ICD-10-CM | POA: Diagnosis not present

## 2022-07-30 DIAGNOSIS — Z419 Encounter for procedure for purposes other than remedying health state, unspecified: Secondary | ICD-10-CM | POA: Diagnosis not present

## 2022-08-30 DIAGNOSIS — Z419 Encounter for procedure for purposes other than remedying health state, unspecified: Secondary | ICD-10-CM | POA: Diagnosis not present

## 2022-09-30 DIAGNOSIS — Z419 Encounter for procedure for purposes other than remedying health state, unspecified: Secondary | ICD-10-CM | POA: Diagnosis not present

## 2022-10-29 DIAGNOSIS — Z419 Encounter for procedure for purposes other than remedying health state, unspecified: Secondary | ICD-10-CM | POA: Diagnosis not present

## 2022-11-29 DIAGNOSIS — Z419 Encounter for procedure for purposes other than remedying health state, unspecified: Secondary | ICD-10-CM | POA: Diagnosis not present

## 2022-12-29 DIAGNOSIS — Z419 Encounter for procedure for purposes other than remedying health state, unspecified: Secondary | ICD-10-CM | POA: Diagnosis not present

## 2023-03-22 ENCOUNTER — Telehealth: Payer: Self-pay

## 2023-03-22 NOTE — Telephone Encounter (Signed)
LVM for patient to call back [phone], or to call PCP office to schedule follow up apt. AS, CMA
